# Patient Record
Sex: Female | Born: 1975 | Race: White | Hispanic: No | Marital: Married | Smoking: Never smoker
Health system: Southern US, Community
[De-identification: ages and names within clinical notes are randomized; demographics above are authoritative.]

## PROBLEM LIST (undated history)

## (undated) DIAGNOSIS — F32A Depression, unspecified: Secondary | ICD-10-CM

## (undated) DIAGNOSIS — E559 Vitamin D deficiency, unspecified: Secondary | ICD-10-CM

## (undated) DIAGNOSIS — F419 Anxiety disorder, unspecified: Secondary | ICD-10-CM

## (undated) HISTORY — DX: Anxiety disorder, unspecified: F41.9

## (undated) HISTORY — DX: Vitamin D deficiency, unspecified: E55.9

## (undated) HISTORY — DX: Depression, unspecified: F32.A

---

## 2011-03-03 DIAGNOSIS — F429 Obsessive-compulsive disorder, unspecified: Secondary | ICD-10-CM | POA: Insufficient documentation

## 2013-10-04 DIAGNOSIS — Z0000 Encounter for general adult medical examination without abnormal findings: Secondary | ICD-10-CM | POA: Insufficient documentation

## 2013-10-21 DIAGNOSIS — R87619 Unspecified abnormal cytological findings in specimens from cervix uteri: Secondary | ICD-10-CM | POA: Insufficient documentation

## 2016-05-17 ENCOUNTER — Encounter: Payer: Self-pay | Admitting: Emergency Medicine

## 2016-05-17 ENCOUNTER — Emergency Department
Admission: EM | Admit: 2016-05-17 | Discharge: 2016-05-17 | Disposition: A | Discharge status: Home / Self Care | Payer: Medicaid Other | Attending: Emergency Medicine | Admitting: Emergency Medicine

## 2016-05-17 ENCOUNTER — Emergency Department: Payer: Medicaid Other

## 2016-05-17 DIAGNOSIS — M5416 Radiculopathy, lumbar region: Secondary | ICD-10-CM | POA: Diagnosis not present

## 2016-05-17 DIAGNOSIS — M545 Low back pain: Secondary | ICD-10-CM | POA: Diagnosis present

## 2016-05-17 LAB — POCT PREGNANCY, URINE: PREG TEST UR: NEGATIVE

## 2016-05-17 MED ORDER — ORPHENADRINE CITRATE 30 MG/ML IJ SOLN
60.0000 mg | Freq: Two times a day (BID) | INTRAMUSCULAR | Status: DC
  Administered 2016-05-17: 60 mg via INTRAMUSCULAR
  Filled 2016-05-17: qty 2

## 2016-05-17 MED ORDER — DEXAMETHASONE SODIUM PHOSPHATE 10 MG/ML IJ SOLN
10.0000 mg | Freq: Once | INTRAMUSCULAR | Status: AC
  Administered 2016-05-17: 10 mg via INTRAMUSCULAR
  Filled 2016-05-17: qty 1

## 2016-05-17 MED ORDER — HYDROMORPHONE HCL 1 MG/ML IJ SOLN
1.0000 mg | Freq: Once | INTRAMUSCULAR | Status: AC
  Administered 2016-05-17: 1 mg via INTRAMUSCULAR
  Filled 2016-05-17: qty 1

## 2016-05-17 MED ORDER — CYCLOBENZAPRINE HCL 10 MG PO TABS: 10.0000 mg | ORAL_TABLET | Freq: Three times a day (TID) | ORAL | 0 refills | Status: DC | PRN

## 2016-05-17 MED ORDER — OXYCODONE-ACETAMINOPHEN 5-325 MG PO TABS: 1.0000 | ORAL_TABLET | Freq: Four times a day (QID) | ORAL | 0 refills | Status: DC | PRN

## 2016-05-17 MED ORDER — METHYLPREDNISOLONE 4 MG PO TBPK: ORAL_TABLET | ORAL | 0 refills | Status: DC

## 2016-05-17 NOTE — ED Notes (Signed)
See triage note  States she developed lower back pain this am while in shower  Pain is mainly to left side of back and radiates into left leg..Marland Kitchen

## 2016-05-17 NOTE — ED Provider Notes (Signed)
[facility] Emergency Department Provider Note   ____________________________________________   First MD Initiated Contact with Patient 05/17/16 0935     (approximate)  I have reviewed the triage vital signs and the nursing notes.   HISTORY  Chief Complaint Back Pain    HPI Beola CordStacy Codd is a 40 y.o. female patient complaining of acute onset of left radicular back pain which occurred this morning. Patient states she's developed back pain while in the shower and the pain has become acutely radiating to the left leg. Patient denies any bladder or bowel dysfunction. Patient states the first time she has had this type of problem. Patient rates the pain as a 10 over 10. She describes pain as sharp. No palliative measures prior to arrival.  History reviewed. No pertinent past medical history.  There are no active problems to display for this patient.   History reviewed. No pertinent surgical history.  Prior to Admission medications   Medication Sig Start Date End Date Taking? Authorizing Provider  cyclobenzaprine (FLEXERIL) 10 MG tablet Take 1 tablet (10 mg total) by mouth 3 (three) times daily as needed. 05/17/16   Joni Reiningonald K Smith, PA-C  methylPREDNISolone (MEDROL DOSEPAK) 4 MG TBPK tablet Take Tapered dose as directed 05/17/16   Joni Reiningonald K Smith, PA-C  oxyCODONE-acetaminophen (ROXICET) 5-325 MG tablet Take 1 tablet by mouth every 6 (six) hours as needed for moderate pain. 05/17/16   Joni Reiningonald K Smith, PA-C    Allergies Patient has no known allergies.  No family history on file.  Social History Social History  Substance Use Topics  . Smoking status: Never Smoker  . Smokeless tobacco: Never Used  . Alcohol use No    Review of Systems Constitutional: No fever/chills Eyes: No visual changes. ENT: No sore throat. Cardiovascular: Denies chest pain. Respiratory: Denies shortness of breath. Gastrointestinal: No abdominal pain.  No nausea, no vomiting.  No  diarrhea.  No constipation. Genitourinary: Negative for dysuria. Musculoskeletal: Acute back pain  Skin: Negative for rash. Neurological: Negative for headaches, focal weakness or numbness.   ____________________________________________   PHYSICAL EXAM: VITAL SIGNS: ED Triage Vitals  Enc Vitals Group     BP 05/17/16 0923 (!) 142/93     Pulse Rate 05/17/16 0923 83     Resp 05/17/16 0923 18     Temp 05/17/16 0923 97.5 F (36.4 C)     Temp Source 05/17/16 0923 Oral     SpO2 05/17/16 0923 100 %     Weight 05/17/16 0915 262 lb (118.8 kg)     Height --      Head Circumference --      Peak Flow --      Pain Score 05/17/16 0916 10     Pain Loc --      Pain Edu? --      Excl. in GC? --     Constitutional: Alert and oriented. Moderate distress. Morbid obesity Eyes: Conjunctivae are normal. PERRL. EOMI. Head: Atraumatic. Nose: No congestion/rhinnorhea. Mouth/Throat: Mucous membranes are moist.  Oropharynx non-erythematous. Neck: No stridor.   Hematological/Lymphatic/Immunilogical: No cervical lymphadenopathy. Cardiovascular: Normal rate, regular rhythm. Grossly normal heart sounds.  Good peripheral circulation. Respiratory: Normal respiratory effort.  No retractions. Lungs CTAB. Gastrointestinal: Soft and nontender. No distention. No abdominal bruits. No CVA tenderness. Musculoskeletal: No lower extremity tenderness nor edema.  No joint effusions. Neurologic:  Normal speech and language. No gross focal neurologic deficits are appreciated. No gait instability. Skin:  Skin is warm, dry and intact.  No rash noted. Psychiatric: Mood and affect are normal. Speech and behavior are normal.  ____________________________________________   LABS (all labs ordered are listed, but only abnormal results are displayed)  Labs Reviewed  POC URINE PREG, ED  POCT PREGNANCY, URINE    ____________________________________________  EKG   ____________________________________________  RADIOLOGY   ____________________________________________   PROCEDURES  Procedure(s) performed: None  Procedures  Critical Care performed: No  ____________________________________________   INITIAL IMPRESSION / ASSESSMENT AND PLAN / ED COURSE  Pertinent labs & imaging results that were available during my care of the patient were reviewed by me and considered in my medical decision making (see chart for details).  Radicular back pain. Patient given discharge care instructions. Patient given a prescription for Percocet, Flexeril, and the Medrol Dosepak. Patient advised follow-up family doctor this condition persists.   Clinical Course   One hour status post Dilaudid, Norflex, Decadron patient state pain more 80% resolved. Patient states she still has some mild pain with movement of the left lower extremity. This x-ray findings with the patient which are unremarkable for her complaint. Patient denies any abdominal pain secondary to a mild gastric distention and small bowels.    ____________________________________________   FINAL CLINICAL IMPRESSION(S) / ED DIAGNOSES  Final diagnoses:  Acute radicular low back pain      NEW MEDICATIONS STARTED DURING THIS VISIT:  New Prescriptions   CYCLOBENZAPRINE (FLEXERIL) 10 MG TABLET    Take 1 tablet (10 mg total) by mouth 3 (three) times daily as needed.   METHYLPREDNISOLONE (MEDROL DOSEPAK) 4 MG TBPK TABLET    Take Tapered dose as directed   OXYCODONE-ACETAMINOPHEN (ROXICET) 5-325 MG TABLET    Take 1 tablet by mouth every 6 (six) hours as needed for moderate pain.     Note:  This document was prepared using Dragon voice recognition software and may include unintentional dictation errors.    Joni Reiningonald K Smith, PA-C 05/17/16 1123    Jene Everyobert Kinner, MD 05/17/16 1414

## 2016-05-17 NOTE — ED Triage Notes (Signed)
Pt to ed with c/o back pain that started this am.  Pt denies injury.

## 2016-08-25 ENCOUNTER — Emergency Department
Admission: EM | Admit: 2016-08-25 | Discharge: 2016-08-25 | Disposition: A | Discharge status: Home / Self Care | Payer: Medicaid Other | Attending: Emergency Medicine | Admitting: Emergency Medicine

## 2016-08-25 ENCOUNTER — Emergency Department: Payer: Medicaid Other

## 2016-08-25 ENCOUNTER — Encounter: Payer: Self-pay | Admitting: Emergency Medicine

## 2016-08-25 DIAGNOSIS — J111 Influenza due to unidentified influenza virus with other respiratory manifestations: Secondary | ICD-10-CM | POA: Insufficient documentation

## 2016-08-25 DIAGNOSIS — Z79899 Other long term (current) drug therapy: Secondary | ICD-10-CM | POA: Insufficient documentation

## 2016-08-25 MED ORDER — HYDROCOD POLST-CPM POLST ER 10-8 MG/5ML PO SUER: 5.0000 mL | Freq: Two times a day (BID) | ORAL | 0 refills | Status: DC | PRN

## 2016-08-25 MED ORDER — BENZONATATE 100 MG PO CAPS
200.0000 mg | ORAL_CAPSULE | Freq: Once | ORAL | Status: AC
  Administered 2016-08-25: 200 mg via ORAL
  Filled 2016-08-25: qty 2

## 2016-08-25 NOTE — ED Notes (Signed)
Pt states she has had this nausea x 2 days, but tonight woke up to a temperature of 102.5.  She took Dayquil and as of triage temp is 98.5

## 2016-08-25 NOTE — ED Triage Notes (Signed)
Pt ambulatory to triage with steady gait, no distress noted. Pt c/o of cough, fever and chills x1 day, unrelieved with OTC meds.

## 2016-08-27 NOTE — ED Provider Notes (Signed)
[facility] Emergency Department Provider Note    First MD Initiated Contact with Patient 08/25/16 [phone]     (approximate)  I have reviewed the triage vital signs and the nursing notes.   HISTORY  Chief Complaint Fever; Chills; and Cough    HPI Carly CordStacy Hooper is a 41 y.o. female presents with one-day history of cough fever or chills and nasal congestion unrelieved with over-the-counter medications.   Past medical history No pertinent past medical history There are no active problems to display for this patient.   Past surgical history No pertinent past surgical history  Prior to Admission medications   Medication Sig Start Date End Date Taking? Authorizing Provider  chlorpheniramine-HYDROcodone (TUSSIONEX PENNKINETIC ER) 10-8 MG/5ML SUER Take 5 mLs by mouth every 12 (twelve) hours as needed. 08/25/16   Darci Currentandolph N Maritza Goldsborough, MD  cyclobenzaprine (FLEXERIL) 10 MG tablet Take 1 tablet (10 mg total) by mouth 3 (three) times daily as needed. 05/17/16   Joni Reiningonald K Smith, PA-C  methylPREDNISolone (MEDROL DOSEPAK) 4 MG TBPK tablet Take Tapered dose as directed 05/17/16   Joni Reiningonald K Smith, PA-C  oxyCODONE-acetaminophen (ROXICET) 5-325 MG tablet Take 1 tablet by mouth every 6 (six) hours as needed for moderate pain. 05/17/16   Joni Reiningonald K Smith, PA-C    Allergies No known drug allergies History reviewed. No pertinent family history.  Social History Social History  Substance Use Topics  . Smoking status: Never Smoker  . Smokeless tobacco: Never Used  . Alcohol use No    Review of Systems Constitutional: Positive for fever/chills Eyes: No visual changes. ENT: No sore throat. Cardiovascular: Denies chest pain. Respiratory: Denies shortness of breath. Positive for cough Gastrointestinal: No abdominal pain.  No nausea, no vomiting.  No diarrhea.  No constipation. Genitourinary: Negative for dysuria. Musculoskeletal: Negative for back pain. Skin: Negative for  rash. Neurological: Negative for headaches, focal weakness or numbness.  10-point ROS otherwise negative.  ____________________________________________   PHYSICAL EXAM:  VITAL SIGNS: ED Triage Vitals  Enc Vitals Group     BP 08/25/16 0313 (!) 149/92     Pulse Rate 08/25/16 0313 (!) 110     Resp 08/25/16 0313 16     Temp 08/25/16 0313 98.5 F (36.9 C)     Temp Source 08/25/16 0313 Oral     SpO2 08/25/16 0313 97 %     Weight 08/25/16 0313 265 lb (120.2 kg)     Height 08/25/16 0313 5\' 5"  (1.651 m)     Head Circumference --      Peak Flow --      Pain Score 08/25/16 0504 3     Pain Loc --      Pain Edu? --      Excl. in GC? --     Constitutional: Alert and oriented. Well appearing and in no acute distress. Eyes: Conjunctivae are normal. PERRL. EOMI. Head: Atraumatic. Nose: No congestion/rhinnorhea. Mouth/Throat: Mucous membranes are moist.  Oropharynx non-erythematous. Neck: No stridor.  No meningeal signs.  No cervical spine tenderness to palpation. Cardiovascular: Normal rate, regular rhythm. Good peripheral circulation. Grossly normal heart sounds. Respiratory: Normal respiratory effort.  No retractions. Lungs CTAB. Gastrointestinal: Soft and nontender. No distention.  Musculoskeletal: No lower extremity tenderness nor edema. No gross deformities of extremities. Neurologic:  Normal speech and language. No gross focal neurologic deficits are appreciated.  Skin:  Skin is warm, dry and intact. No rash noted.     RADIOLOGY I, Bayamon N Cohen Doleman, personally viewed and  evaluated these images (plain radiographs) as part of my medical decision making, as well as reviewing the written report by the radiologist.   __CLINICAL DATA:  Cough, fever, and chills for 1 day.  EXAM: CHEST  2 VIEW  COMPARISON:  None.  FINDINGS: The heart size and mediastinal contours are within normal limits. Both lungs are clear. The visualized skeletal structures  are unremarkable.  IMPRESSION: No active cardiopulmonary disease.   Electronically Signed   By: Burman Nieves M.D.   On: 08/25/2016 05:45__________________________________________   Procedures     INITIAL IMPRESSION / ASSESSMENT AND PLAN / ED COURSE  Pertinent labs & imaging results that were available during my care of the patient were reviewed by me and considered in my medical decision making (see chart for details).  I offered Tamiflu to the patient however she states that she would be unable to purchase it secondary to cost and a such states that she did not want the prescription. I informed the patient warning signs that would warn immediate return to the emergency department.     ____________________________________________  FINAL CLINICAL IMPRESSION(S) / ED DIAGNOSES  Final diagnoses:  Influenza     MEDICATIONS GIVEN DURING THIS VISIT:  Medications  benzonatate (TESSALON) capsule 200 mg (200 mg Oral Given 08/25/16 0611)     NEW OUTPATIENT MEDICATIONS STARTED DURING THIS VISIT:  Discharge Medication List as of 08/25/2016  6:19 AM    START taking these medications   Details  chlorpheniramine-HYDROcodone (TUSSIONEX PENNKINETIC ER) 10-8 MG/5ML SUER Take 5 mLs by mouth every 12 (twelve) hours as needed., Starting Thu 08/25/2016, Print        Discharge Medication List as of 08/25/2016  6:19 AM      Discharge Medication List as of 08/25/2016  6:19 AM       Note:  This document was prepared using Dragon voice recognition software and may include unintentional dictation errors.    Darci Current, MD 08/27/16 [phone]

## 2017-10-11 IMAGING — CR DG LUMBAR SPINE COMPLETE 4+V
5 series · 5 of 5 positions shown · non-contrast
Comparison: None.

CLINICAL DATA: Left back pain

EXAM:
LUMBAR SPINE - COMPLETE 4+ VIEW

[l-spine ap]
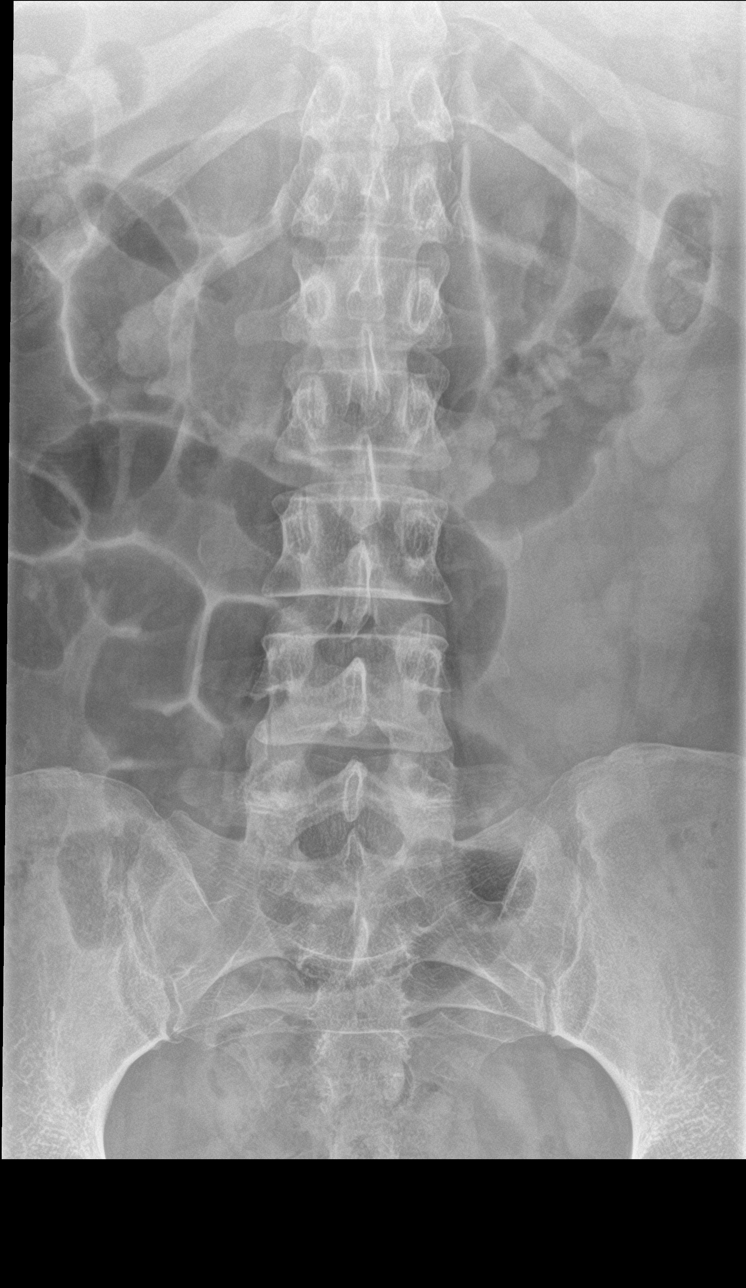

[l-spine obl (1 of 2)]
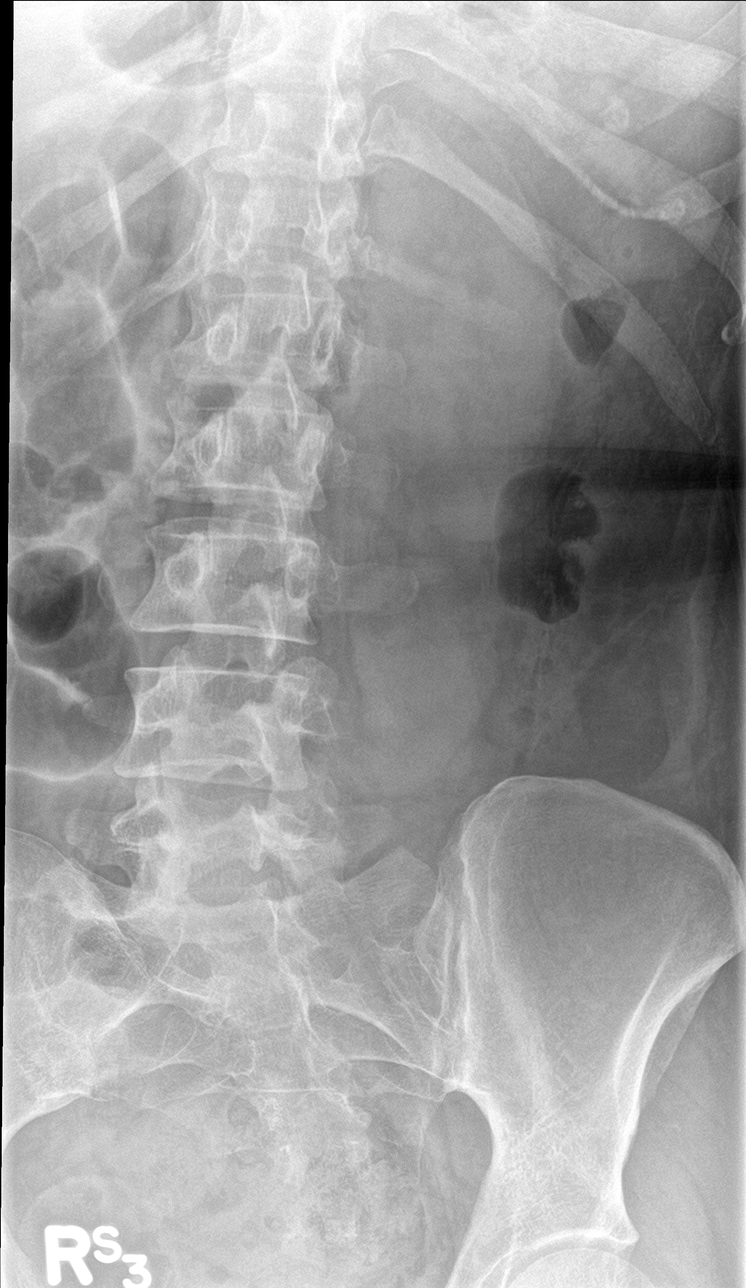

[l-spine obl (2 of 2)]
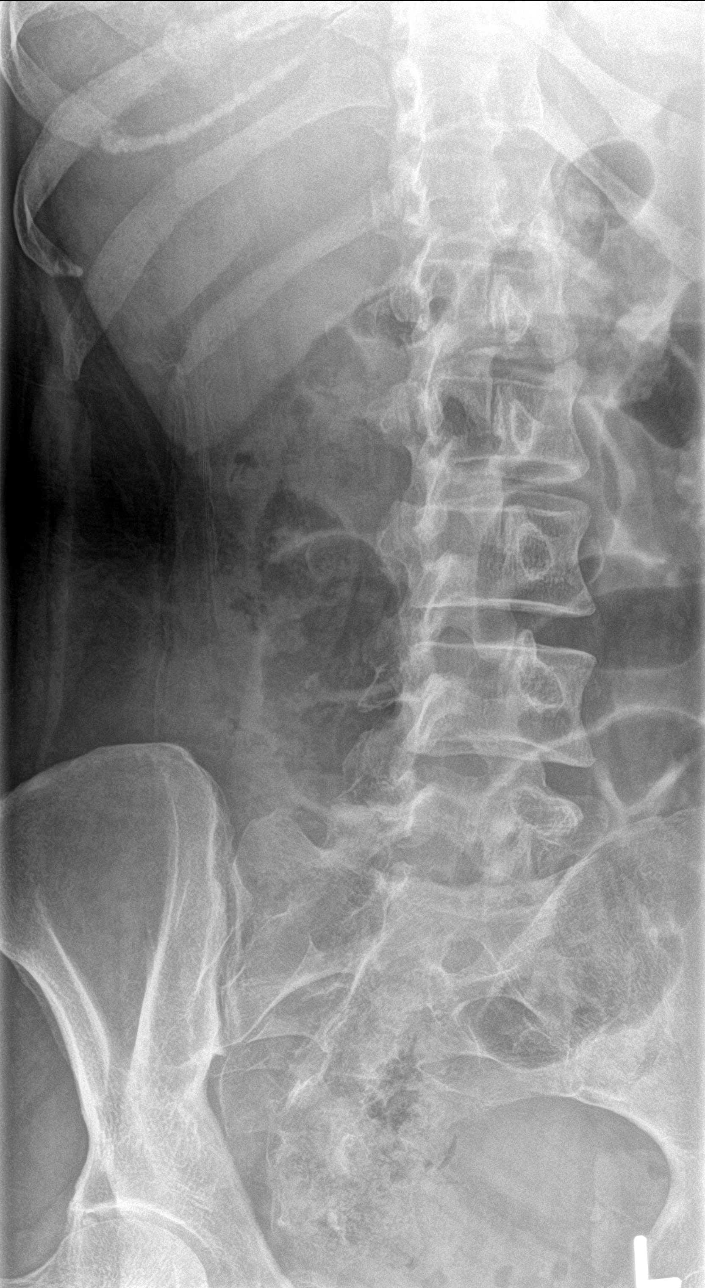

[l-spine lat]
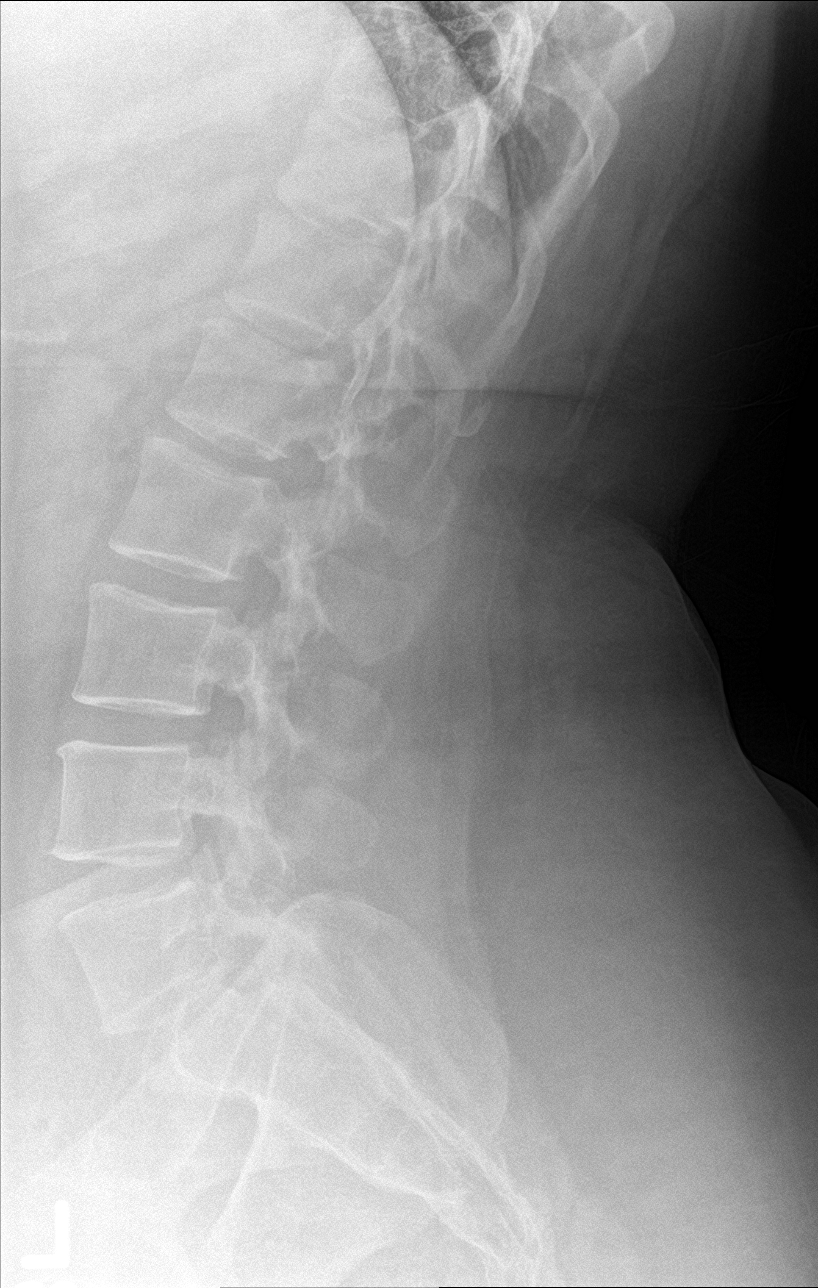

[l-spine spot]
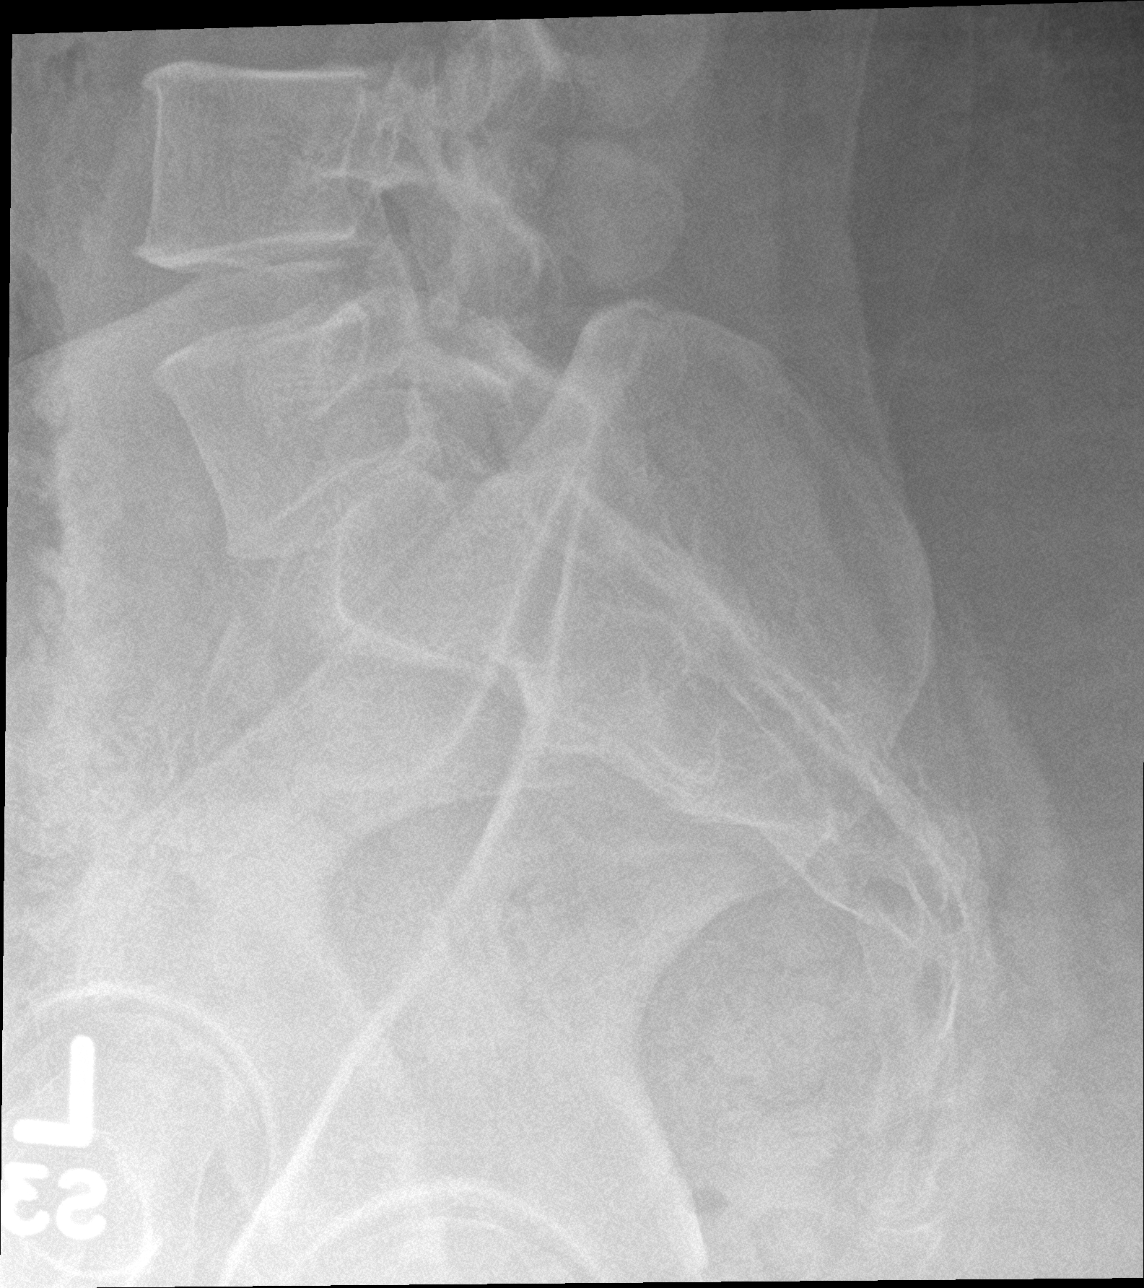

[5 of 5 positions shown; findings below may reference images not displayed]

FINDINGS: Mild gaseous distended small bowel loops mid abdomen suspicious for
ileus or enteritis. Five views of the lumbar spine submitted.
Alignment and vertebral body heights are preserved. No acute
fracture or subluxation
IMPRESSION: No acute fracture or subluxation. Mild gaseous distended small bowel
loops mid abdomen suspicious for ileus or enteritis. Clinical
correlation is necessary.

## 2018-07-05 ENCOUNTER — Other Ambulatory Visit: Payer: Self-pay | Admitting: Internal Medicine

## 2018-07-05 DIAGNOSIS — M545 Low back pain, unspecified: Secondary | ICD-10-CM

## 2018-07-05 DIAGNOSIS — M5441 Lumbago with sciatica, right side: Secondary | ICD-10-CM

## 2018-07-05 DIAGNOSIS — G8929 Other chronic pain: Secondary | ICD-10-CM

## 2018-07-17 ENCOUNTER — Other Ambulatory Visit: Payer: Self-pay

## 2020-06-23 ENCOUNTER — Encounter: Payer: Self-pay | Admitting: *Deleted

## 2020-06-23 ENCOUNTER — Encounter: Payer: Self-pay | Admitting: Podiatry

## 2020-06-23 ENCOUNTER — Other Ambulatory Visit: Payer: Self-pay

## 2020-06-23 ENCOUNTER — Ambulatory Visit (INDEPENDENT_AMBULATORY_CARE_PROVIDER_SITE_OTHER): Payer: Commercial Managed Care - PPO

## 2020-06-23 ENCOUNTER — Ambulatory Visit (INDEPENDENT_AMBULATORY_CARE_PROVIDER_SITE_OTHER): Payer: Self-pay | Admitting: Podiatry

## 2020-06-23 DIAGNOSIS — M722 Plantar fascial fibromatosis: Secondary | ICD-10-CM | POA: Diagnosis not present

## 2020-06-23 MED ORDER — MELOXICAM 15 MG PO TABS: 15.0000 mg | ORAL_TABLET | Freq: Every day | ORAL | 1 refills | Status: DC

## 2020-06-23 MED ORDER — METHYLPREDNISOLONE 4 MG PO TBPK: ORAL_TABLET | ORAL | 0 refills | Status: DC

## 2020-06-23 MED ORDER — BETAMETHASONE SOD PHOS & ACET 6 (3-3) MG/ML IJ SUSP
3.0000 mg | Freq: Once | INTRAMUSCULAR | Status: AC
  Administered 2020-06-23: 3 mg via INTRAMUSCULAR

## 2020-06-23 NOTE — Progress Notes (Signed)
   Subjective: 44 y.o. female presenting today for right heel pain has been going on for approximately 1 year now.  She has not done anything for treatment other than go to the [employer] and spend $1800 on insoles and shoes.  Patient continues to have pain and tenderness to the right heel.  She presents for further treatment and evaluation   No past medical history on file.   Objective: Physical Exam General: The patient is alert and oriented x3 in no acute distress.  Dermatology: Skin is warm, dry and supple bilateral lower extremities. Negative for open lesions or macerations bilateral.   Vascular: Dorsalis Pedis and Posterior Tibial pulses palpable bilateral.  Capillary fill time is immediate to all digits.  Neurological: Epicritic and protective threshold intact bilateral.   Musculoskeletal: Tenderness to palpation to the plantar aspect of the right heel along the plantar fascia. All other joints range of motion within normal limits bilateral. Strength 5/5 in all groups bilateral.   Radiographic exam: Normal osseous mineralization. Joint spaces preserved. No fracture/dislocation/boney destruction. No other soft tissue abnormalities or radiopaque foreign bodies.   Assessment: 1. Plantar fasciitis right  Plan of Care:  1. Patient evaluated. Xrays reviewed.   2. Injection of 0.5cc Celestone soluspan injected into the right plantar fascia  3. Rx for Medrol Dose Pack placed 4. Rx for Meloxicam ordered for patient. 5.  Continue OTC insoles from [employer] store 6. Instructed patient regarding therapies and modalities at home to alleviate symptoms.  7. 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  Transferring to [employer] for work.   Felecia Shelling, DPM [facility]  Dr. Felecia Shelling, DPM    [address] [place], [place] [address]                Office [phone]   Fax [phone]

## 2020-08-11 ENCOUNTER — Encounter: Payer: Self-pay | Admitting: Podiatry

## 2020-09-15 ENCOUNTER — Ambulatory Visit: Payer: Self-pay | Admitting: Podiatry

## 2020-10-19 ENCOUNTER — Other Ambulatory Visit: Payer: Self-pay

## 2020-10-19 ENCOUNTER — Encounter: Payer: Self-pay | Admitting: Obstetrics and Gynecology

## 2020-10-19 ENCOUNTER — Other Ambulatory Visit (HOSPITAL_COMMUNITY)
Admission: RE | Admit: 2020-10-19 | Discharge: 2020-10-19 | Disposition: A | Discharge status: Home / Self Care | Payer: Self-pay | Source: Ambulatory Visit | Attending: Obstetrics and Gynecology | Admitting: Obstetrics and Gynecology

## 2020-10-19 ENCOUNTER — Ambulatory Visit (INDEPENDENT_AMBULATORY_CARE_PROVIDER_SITE_OTHER): Payer: BC Managed Care – PPO | Admitting: Obstetrics and Gynecology

## 2020-10-19 VITALS — BP 110/86 | Ht 65.0 in | Wt 246.0 lb

## 2020-10-19 DIAGNOSIS — Z1239 Encounter for other screening for malignant neoplasm of breast: Secondary | ICD-10-CM

## 2020-10-19 DIAGNOSIS — Z7185 Encounter for immunization safety counseling: Secondary | ICD-10-CM

## 2020-10-19 DIAGNOSIS — Z01419 Encounter for gynecological examination (general) (routine) without abnormal findings: Secondary | ICD-10-CM

## 2020-10-19 DIAGNOSIS — Z0000 Encounter for general adult medical examination without abnormal findings: Secondary | ICD-10-CM | POA: Diagnosis not present

## 2020-10-19 DIAGNOSIS — Z124 Encounter for screening for malignant neoplasm of cervix: Secondary | ICD-10-CM

## 2020-10-19 NOTE — Progress Notes (Signed)
Gynecology Annual Exam  PCP: Hoffmeier, Jackelyn Knife, MD  Chief Complaint:  Chief Complaint  Patient presents with  . Gynecologic Exam    No concerns    History of Present Illness: Patient is a 45 y.o. [id number] presents for annual exam. The patient has no complaints today. She would like to establish care with an OBGYN office.   LMP: Patient's last menstrual period was 10/01/2020 (approximate). Average Interval: regular, 28 days Duration of flow: 7 days Heavy Menses: On the first 3 days of her cycle Clots: yes Intermenstrual Bleeding: no Postcoital Bleeding: no Dysmenorrhea: "Normal cramping" per patient   The patient is sexually active. She currently uses tubal ligation for contraception. She denies dyspareunia.   There is no notable family history of breast or ovarian cancer in her family.  The patient wears seatbelts: yes.   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    The patient denies current symptoms of depression. Patient has been diagnosed with depression and anxiety - is currently managing very well with Cymbalta daily.  Review of Systems: Review of Systems  Constitutional: Negative.   HENT: Negative.   Eyes: Negative.   Respiratory: Negative.   Cardiovascular: Negative.   Gastrointestinal: Negative.   Genitourinary: Negative.   Musculoskeletal: Negative.   Skin: Negative.   Neurological: Negative.   Endo/Heme/Allergies: Negative.   Psychiatric/Behavioral: Positive for depression. Negative for suicidal ideas. The patient is nervous/anxious.     Past Medical History:  Patient Active Problem List   Diagnosis Date Noted  . 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 Pap guidelines as of 09/2011:  No Paps under age 87. Do not order HPV or reflex HPV for ages 21-24; there is no role for HPV in young women.  Age 33-24: no colpo for ASCUS or LSIL; just yearly  repeat Paps. Only do colpo for ASC-H and HSIL.  Continue STI screening under age 28 yearly.  Age 33-30 need only do Paps every 3 years if they are normal. No HPV testing unless reflex HPV ages 69-30.  Over age 40, do Paps and HPV for screening. If normal repeat in 5 years. If Pap normal and HPV+, repeat in 1 year.  GreatestFeeling.no    . Obesity 03/03/2011    Overview:  Formatting of this note may be different from the original. Body mass index is 41.97 kg/(m^2).  Wt Readings from Last 3 Encounters:  10/23/15 (!) 117.9 kg (260 lb)  02/14/15 (!) 117.9 kg (260 lb)  07/24/14 (!) 112.5 kg (248 lb)    . OCD (obsessive compulsive disorder) 03/03/2011    Overview:  Followed by psychiatry ? Bipolar     Past Surgical History:  History reviewed. No pertinent surgical history.  Gynecologic History:  Patient's last menstrual period was 10/01/2020 (approximate). Contraception: tubal ligation Last Pap: 2019 Results were: NILM - per patient report. Patient does report history of HPV+ pap smears, has since had "normal" results. Last mammogram: Patient is scheduled for her first mammogram tomorrow.  Obstetric History: [id number]  Family History:  Family History  Problem Relation Age of Onset  . Prostate cancer Father     Social History:  Social History   Socioeconomic History  . Marital status: Married    Spouse name: Not on file  . Number of children: Not on file  . Years of education: Not on file  . Highest education level:  Not on file  Occupational History  . Not on file  Tobacco Use  . Smoking status: Never Smoker  . Smokeless tobacco: Never Used  Vaping Use  . Vaping Use: Some days  Substance and Sexual Activity  . Alcohol use: No  . Drug use: No  . Sexual activity: Yes    Birth control/protection: Surgical    Comment: Tuabl Ligation  Other Topics Concern  . Not on file  Social History Narrative  . Not on file   Social Determinants of Health   Financial Resource  Strain: Not on file  Food Insecurity: Not on file  Transportation Needs: Not on file  Physical Activity: Not on file  Stress: Not on file  Social Connections: Not on file  Intimate Partner Violence: Not on file    Allergies:  No Known Allergies  Medications: Prior to Admission medications   Medication Sig Start Date End Date Taking? Authorizing Provider  DULoxetine (CYMBALTA) 60 MG capsule Take 60 mg by mouth daily. 04/25/20  Yes [provider]    Physical Exam Vitals: Blood pressure 110/86, height 5\' 5"  (1.651 m), weight 246 lb (111.6 kg), last menstrual period 10/01/2020.  General: NAD HEENT: normocephalic, anicteric Thyroid: no enlargement, no palpable nodules Pulmonary: No increased work of breathing, CTAB Cardiovascular: RRR, distal pulses 2+ Breast: Breast symmetrical, no tenderness, no palpable nodules or masses, no skin or nipple retraction present, no nipple discharge.  No axillary or supraclavicular lymphadenopathy. Abdomen: NABS, soft, non-tender, non-distended.  Umbilicus without lesions.  No hepatomegaly, splenomegaly or masses palpable. No evidence of hernia  Genitourinary:  External: Normal external female genitalia.  Normal urethral meatus, normal Bartholin's and Skene's glands.    Vagina: Normal vaginal mucosa, no evidence of prolapse.    Cervix: Grossly normal in appearance, no bleeding  Bimanual deferred  Rectal: deferred  Lymphatic: no evidence of inguinal lymphadenopathy Extremities: no edema, erythema, or tenderness Neurologic: Grossly intact Psychiatric: mood appropriate, affect full   Assessment: 44 y.o. 57 routine annual exam  Plan: Problem List Items Addressed This Visit   None   Visit Diagnoses    Screening for cervical cancer    -  Primary   Relevant Orders   Cytology - PAP   Immunization counseling       Routine health maintenance       Encounter for gynecological examination without abnormal finding       Screening  breast examination          1) Mammogram - recommend yearly screening mammogram.  Mammogram is scheduled for tomorrow.   2) STI screening  wasoffered and declined  3) ASCCP guidelines and rational discussed.  Patient opts for every 5 years screening interval  4) Contraception - the patient is currently using  tubal ligation.  She is happy with her current form of contraception and plans to continue  5) Colonoscopy -- Screening recommended starting at age 8 for average risk individuals, age 20 for individuals deemed at increased risk (including African Americans) and recommended to continue until age 59.  For patient age 48-85 individualized approach is recommended.  Gold standard screening is via colonoscopy, Cologuard screening is an acceptable alternative for patient unwilling or unable to undergo colonoscopy.  "Colorectal cancer screening for average?risk adults: 2018 guideline update from the American Cancer Society"[place]: A Cancer Journal for Clinicians: Dec 07, 2016   6) Routine healthcare maintenance including cholesterol, diabetes screening discussed managed by PCP  7) Return in about 1 year (around 10/19/2021).  Zipporah Plants, CNM, MSN [facility], [facility] 10/19/2020, 10:08 AM

## 2020-10-20 LAB — CYTOLOGY - PAP
Comment: NEGATIVE
Diagnosis: NEGATIVE
High risk HPV: NEGATIVE

## 2023-05-29 ENCOUNTER — Ambulatory Visit: Payer: Self-pay

## 2023-05-29 DIAGNOSIS — Z3009 Encounter for other general counseling and advice on contraception: Secondary | ICD-10-CM
